# Patient Record
Sex: Male | Born: 1980 | Race: White | Hispanic: No | Marital: Single | Smoking: Never smoker
Health system: Southern US, Community
[De-identification: ages and names within clinical notes are randomized; demographics above are authoritative.]

---

## 2005-03-09 ENCOUNTER — Emergency Department (HOSPITAL_COMMUNITY): Admission: EM | Admit: 2005-03-09 | Discharge: 2005-03-09 | Payer: Self-pay | Admitting: Emergency Medicine

## 2018-01-16 ENCOUNTER — Other Ambulatory Visit: Payer: Self-pay | Admitting: Otolaryngology

## 2018-01-16 DIAGNOSIS — R202 Paresthesia of skin: Secondary | ICD-10-CM

## 2018-01-16 DIAGNOSIS — H9313 Tinnitus, bilateral: Secondary | ICD-10-CM

## 2018-01-23 ENCOUNTER — Ambulatory Visit: Payer: Self-pay

## 2018-01-23 ENCOUNTER — Ambulatory Visit
Admission: RE | Admit: 2018-01-23 | Discharge: 2018-01-23 | Disposition: A | Discharge status: Home / Self Care | Payer: BLUE CROSS/BLUE SHIELD | Source: Ambulatory Visit | Attending: Otolaryngology | Admitting: Otolaryngology

## 2018-01-23 ENCOUNTER — Encounter (INDEPENDENT_AMBULATORY_CARE_PROVIDER_SITE_OTHER): Payer: Self-pay

## 2018-01-23 DIAGNOSIS — H9313 Tinnitus, bilateral: Secondary | ICD-10-CM | POA: Diagnosis present

## 2018-01-23 DIAGNOSIS — R202 Paresthesia of skin: Secondary | ICD-10-CM | POA: Insufficient documentation

## 2018-01-23 MED ORDER — GADOBENATE DIMEGLUMINE 529 MG/ML IV SOLN
18.0000 mL | Freq: Once | INTRAVENOUS | Status: AC | PRN
  Administered 2018-01-23: 18 mL via INTRAVENOUS

## 2018-11-06 IMAGING — MR MR BRAIN/IAC WO/W
11 of 12 series · 41 of 48 positions shown · IV contrast (18 ML MULTIHANCE)
Comparison: None.

CLINICAL DATA: Bilateral tinnitus. Pain on the right. Family
history of acoustic neuroma. Duration of symptoms 1 month.

EXAM:
MRI HEAD WITHOUT AND WITH CONTRAST
TECHNIQUE: Multiplanar, multiecho pulse sequences of the brain and surrounding
structures were obtained without and with intravenous contrast.
CONTRAST:  18mL MULTIHANCE GADOBENATE DIMEGLUMINE 529 MG/ML IV SOLN

[Series 2: T1 · sagittal · 5.0mm · 0.45mm/px · 2 of 23 slices shown (1 of 3)]
[im 1/23]
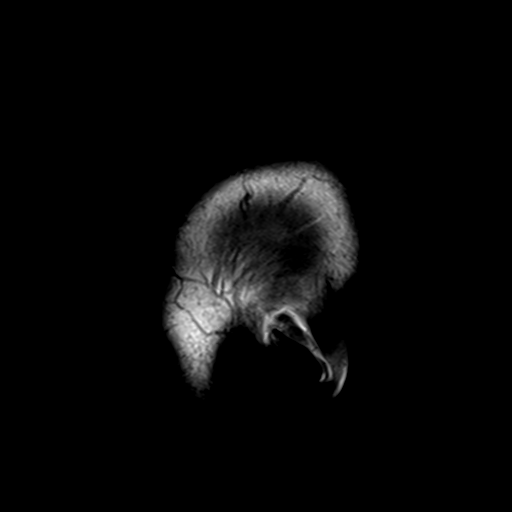
[im 23/23]
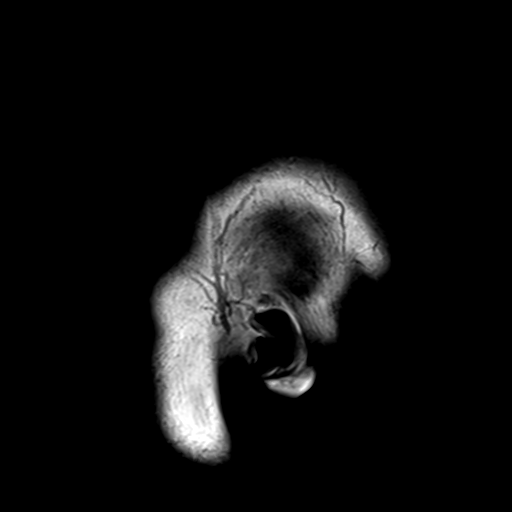

[Series 3: T2 · axial · 5.0mm · 0.72mm/px · z∈[-42,+119]mm · 3 of 24 slices shown (1 of 2)]
[im 1/24]
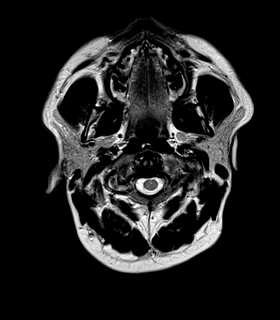
[im 12/24]
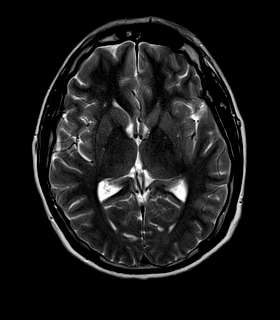
[im 24/24]
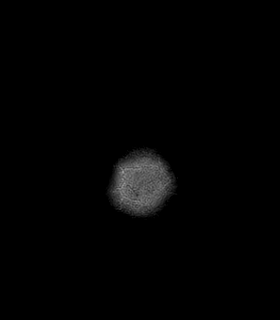

[Series 5: DWI · axial · 3.0mm · 1.20mm/px · z∈[-43,+119]mm · 7 of 55 slices shown (1 of 2)]
[im 1/55]
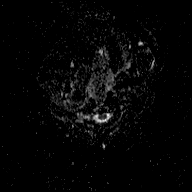
[im 10/55]
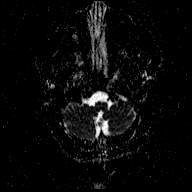
[im 19/55]
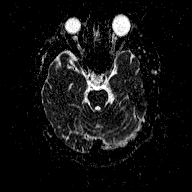
[im 28/55]
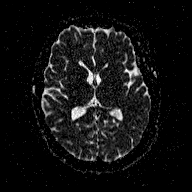
[im 37/55]
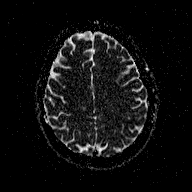
[im 46/55]
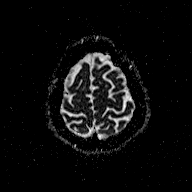
[im 55/55]
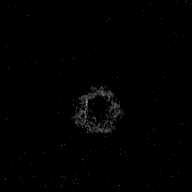

[Series 6: FLAIR · axial · 3.0mm · 0.45mm/px · z∈[-43,+119]mm · 7 of 55 slices shown]
[im 1/55]
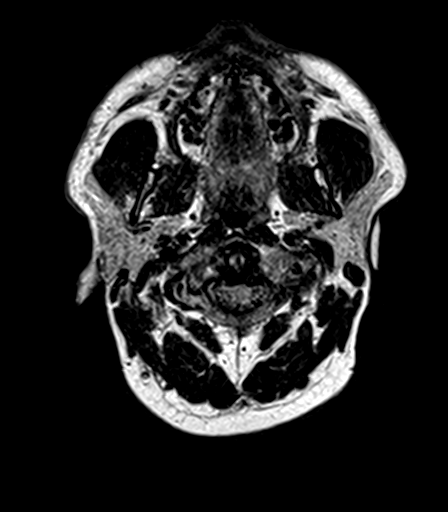
[im 10/55]
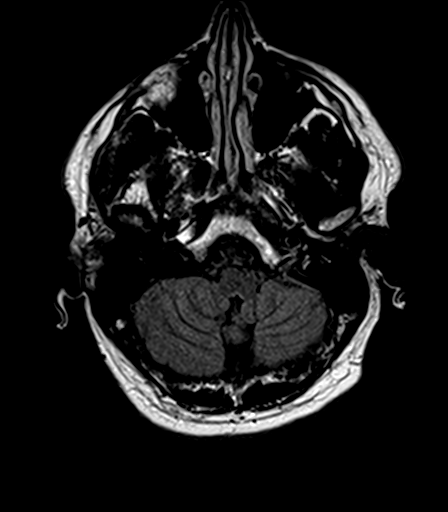
[im 19/55]
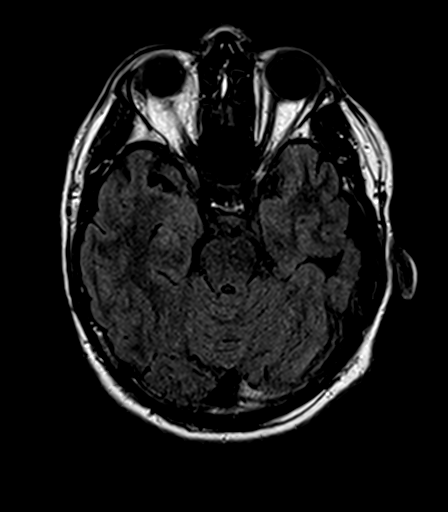
[im 28/55]
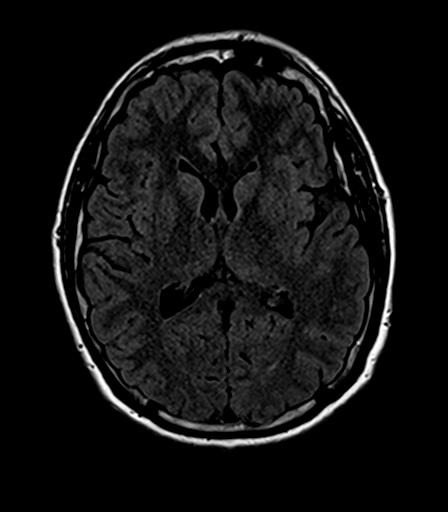
[im 37/55]
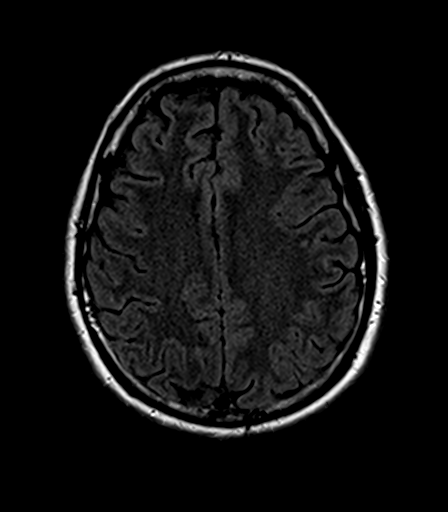
[im 46/55]
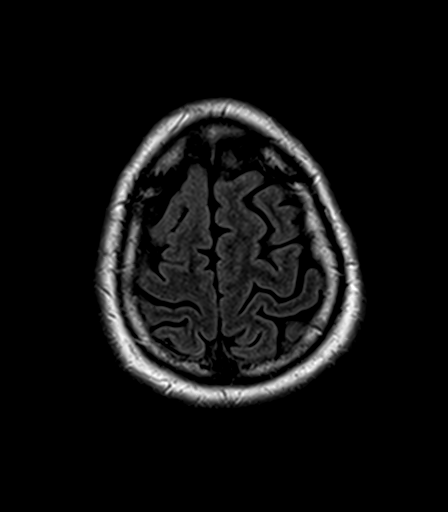
[im 55/55]
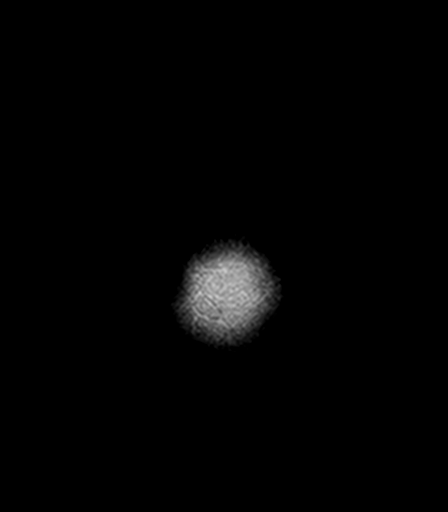

[Series 7: T2 · axial · 5.0mm · 0.72mm/px · z∈[-42,+119]mm · 3 of 24 slices shown (2 of 2)]
[im 1/24]
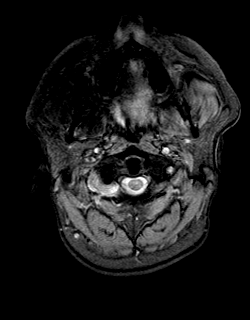
[im 12/24]
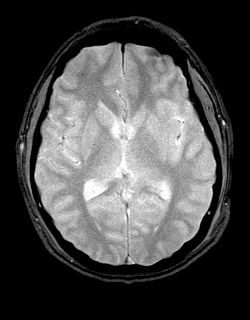
[im 24/24]
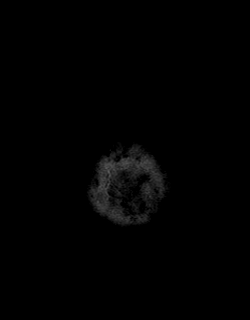

[Series 8: T1 · coronal · 3.0mm · 0.37mm/px · 1 of 11 slices shown (2 of 3)]
[im 1/11]
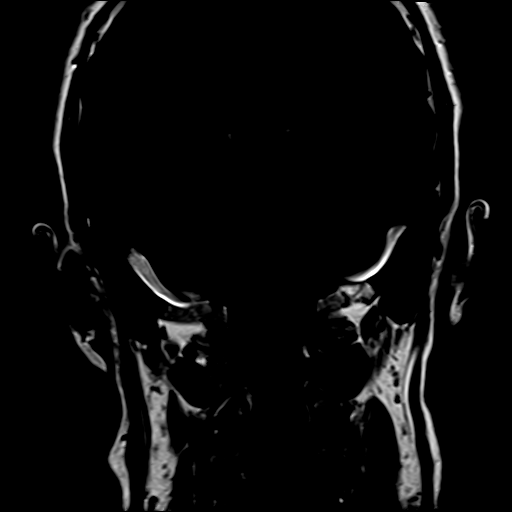

[Series 9: T1 · axial · 3.0mm · 0.37mm/px · 1 of 11 slices shown (3 of 3)]
[im 1/11]
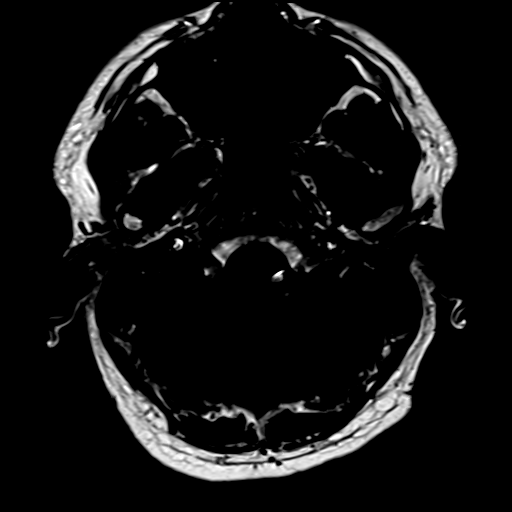

[Series 11: T1 post-contrast · axial · 3.0mm · 0.37mm/px · 1 of 11 slices shown (1 of 3)]
[im 1/11]
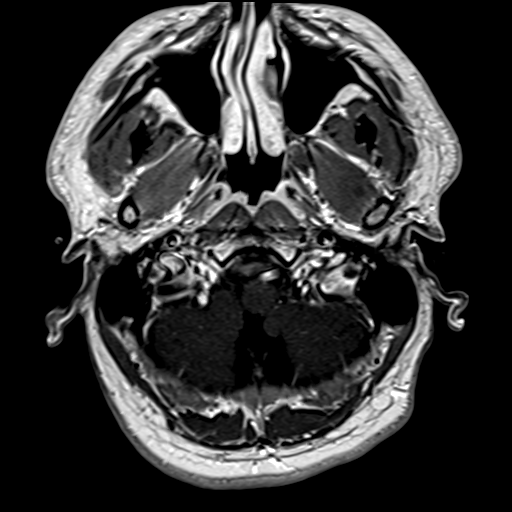

[Series 12: T1 post-contrast · coronal · 3.0mm · 0.37mm/px · 1 of 11 slices shown (2 of 3)]
[im 1/11]
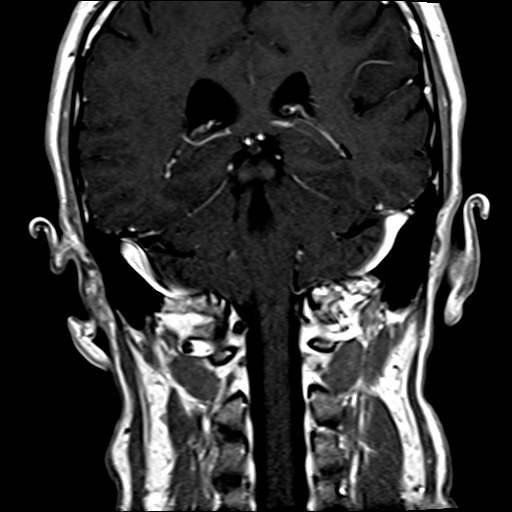

[Series 13: T1 post-contrast · axial · 3.0mm · 1.00mm/px · z∈[-56,+133]mm · 8 of 64 slices shown (3 of 3)]
[im 1/64]
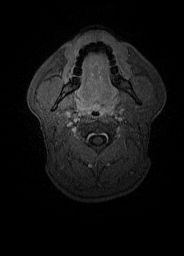
[im 10/64]
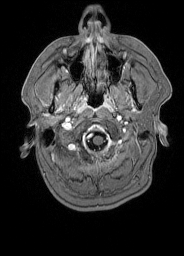
[im 19/64]
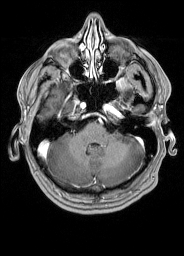
[im 28/64]
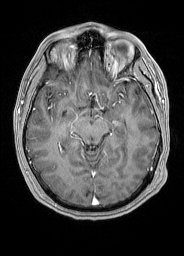
[im 37/64]
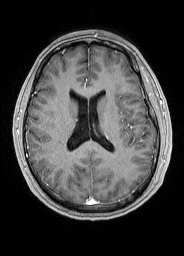
[im 46/64]
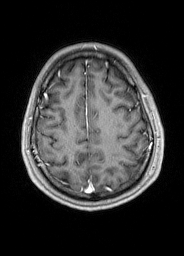
[im 55/64]
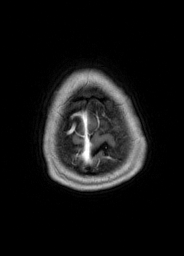
[im 64/64]
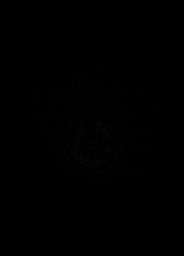

[Series 100: DWI · axial · 3.0mm · 1.20mm/px · z∈[-43,+119]mm · 7 of 55 slices shown (2 of 2)]
[im 1/55]
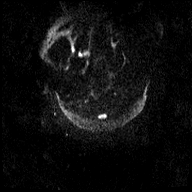
[im 10/55]
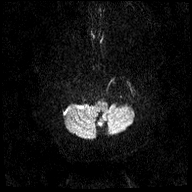
[im 19/55]
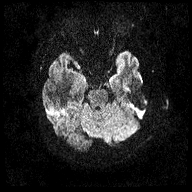
[im 28/55]
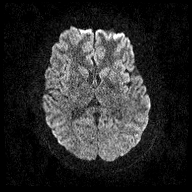
[im 37/55]
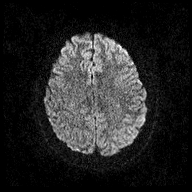
[im 46/55]
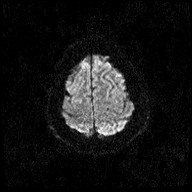
[im 55/55]
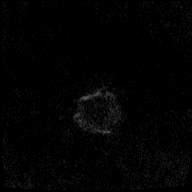

[41 of 48 positions shown; findings below may reference images not displayed]

FINDINGS: Brain: Brain has normal appearance without evidence of malformation,
atrophy, old or acute small or large vessel infarction, mass lesion,
hemorrhage, hydrocephalus or extra-axial collection. CP angle
regions are normal. No vestibular schwannoma. No enhancing neuritis.
No other brain enhancement occurs.

Vascular: Major vessels at the base of the brain show flow. Venous
sinuses appear patent.

Skull and upper cervical spine: Normal.

Sinuses/Orbits: Clear/normal.  Mastoids are clear.

Other: None significant.
IMPRESSION: Normal examination. No abnormality seen to explain the presenting
symptoms.

## 2020-11-09 ENCOUNTER — Ambulatory Visit: Payer: 59 | Admitting: Internal Medicine

## 2020-11-09 NOTE — Progress Notes (Signed)
No show for appointment. Office will call to reschedule.  

## 2020-11-15 NOTE — Progress Notes (Signed)
[facility] [address] [place], [place] [address]  Pulmonary Sleep Medicine   Office Visit Note  Patient Name: Shawn Powers. Oettinger DOB: 1980/12/11 MRN [id number]    Chief Complaint: Obstructive Sleep Apnea visit  Brief History:  Star is seen today for follow up The patient has a  2 year history of sleep apnea. Patient is using PAP nightly.  The patient feels more rested after sleeping with PAP.  The patient reports benefiting from PAP use. Reported sleepiness is  Improved but he is still tired and the Epworth Sleepiness Score is 6 out of 24. The patient occasionally take naps. The patient complains of the following: mask leaking because it is old  The compliance download shows excellent compliance with an average use time of 7.9 hours. The AHI is 0.7  The patient does not complain of limb movements disrupting sleep.  ROS  General: (-) fever, (-) chills, (-) night sweat Nose and Sinuses: (-) nasal stuffiness or itchiness, (-) postnasal drip, (-) nosebleeds, (-) sinus trouble. Mouth and Throat: (-) sore throat, (-) hoarseness. Neck: (-) swollen glands, (-) enlarged thyroid, (-) neck pain. Respiratory: - cough, - shortness of breath, - wheezing. Neurologic: - numbness, - tingling. Psychiatric: + anxiety, + depression   Current Medication: Outpatient Encounter Medications as of 11/16/2020  Medication Sig  . omeprazole (PRILOSEC) 10 MG capsule Take by mouth.  . sertraline (ZOLOFT) 25 MG tablet Take by mouth.   No facility-administered encounter medications on file as of 11/16/2020.    Surgical History: History reviewed. No pertinent surgical history.  Medical History: History reviewed. No pertinent past medical history.  Family History: Non contributory to the present illness  Social History: Social History   Socioeconomic History  . Marital status: Single    Spouse name: Not on file  . Number of children: Not on file  . Years of education: Not on file  .  Highest education level: Not on file  Occupational History  . Not on file  Tobacco Use  . Smoking status: Never Smoker  . Smokeless tobacco: Never Used  Substance and Sexual Activity  . Alcohol use: Yes    Comment: rarely  . Drug use: Never  . Sexual activity: Not on file  Other Topics Concern  . Not on file  Social History Narrative  . Not on file   Social Determinants of Health   Financial Resource Strain: Not on file  Food Insecurity: Not on file  Transportation Needs: Not on file  Physical Activity: Not on file  Stress: Not on file  Social Connections: Not on file  Intimate Partner Violence: Not on file    Vital Signs: Blood pressure 127/84, pulse 87, temperature 98.1 F (36.7 C), resp. rate 18, height 5\' 8"  (1.727 m), weight 226 lb (102.5 kg), SpO2 97 %.  Examination: General Appearance: The patient is well-developed, well-nourished, and in no distress. Neck Circumference: 44 cm Skin: Gross inspection of skin unremarkable. Head: normocephalic, no gross deformities. Eyes: no gross deformities noted. ENT: ears appear grossly normal Neurologic: Alert and oriented. No involuntary movements.    EPWORTH SLEEPINESS SCALE:  Scale:  (0)= no chance of dozing; (1)= slight chance of dozing; (2)= moderate chance of dozing; (3)= high chance of dozing  Chance  Situtation    Sitting and reading: 2    Watching TV: 1    Sitting Inactive in public: 0    As a passenger in car: 0      Lying down to  rest: 2    Sitting and talking: 0    Sitting quielty after lunch: 1    In a car, stopped in traffic: 0   TOTAL SCORE:   6 out of 24    SLEEP STUDIES:  1. PSG 11/24/18 AHI 44 Spo30min 84%   CPAP COMPLIANCE DATA:  Date Range: 11/15/19-11/13/20  Average Daily Use: 7.9 hours  Median Use: 7.9  Compliance for > 4 Hours: 94%  AHI: 0.7 respiratory events per hour  Days Used: 348/365  Mask Leak: 74.2  95th Percentile Pressure: 10         LABS: No  results found for this or any previous visit (from the past 2160 hour(s)).  Radiology: MR BRAIN/IAC W WO CONTRAST  Result Date: 01/23/2018 CLINICAL DATA:  Bilateral tinnitus. Pain on the right. Family history of acoustic neuroma. Duration of symptoms 1 month. EXAM: MRI HEAD WITHOUT AND WITH CONTRAST TECHNIQUE: Multiplanar, multiecho pulse sequences of the brain and surrounding structures were obtained without and with intravenous contrast. CONTRAST:  94mL MULTIHANCE GADOBENATE DIMEGLUMINE 529 MG/ML IV SOLN COMPARISON:  None. FINDINGS: Brain: Brain has normal appearance without evidence of malformation, atrophy, old or acute small or large vessel infarction, mass lesion, hemorrhage, hydrocephalus or extra-axial collection. CP angle regions are normal. No vestibular schwannoma. No enhancing neuritis. No other brain enhancement occurs. Vascular: Major vessels at the base of the brain show flow. Venous sinuses appear patent. Skull and upper cervical spine: Normal. Sinuses/Orbits: Clear/normal.  Mastoids are clear. Other: None significant. IMPRESSION: Normal examination. No abnormality seen to explain the presenting symptoms. Electronically Signed   By: Paulina Fusi M.D.   On: 01/23/2018 10:59    No results found.  No results found.    Assessment and Plan: Patient Active Problem List   Diagnosis Date Noted  . OSA (obstructive sleep apnea) 11/16/2020  . Gastroesophageal reflux disease without esophagitis 11/16/2020  . Anxiety and depression 11/16/2020  . Obesity (BMI 30.0-34.9) 11/16/2020      The patient does tolerate PAP and reports significant benefit from PAP use. The patient was reminded how to clean the CPAP and advised to follow up with his doctor regarding residual fatigue if replacing his mask does not improve it. Mask leak is high due to aging.The patient was also counselled on the importance of a good diet and regular exercise.  The compliance has been excellent and the apnea is  controlled  1. OSA (obstructive sleep apnea)  continue excellent compliance  2. CPAP use counseling CPAP couseling-Discussed importance of adequate CPAP use as well as proper care and cleaning techniques of machine and all supplies.  3. Gastroesophageal reflux disease without esophagitis Continue prilosec.  4. Anxiety and depression Continue zoloft and f/u with PCP.  5. Obesity (BMI 30.0-34.9) Obesity Counseling: Had a lengthy discussion regarding patients BMI and weight issues. Patient was instructed on portion control as well as increased activity. Also discussed caloric restrictions with trying to maintain intake less than 2000 Kcal. Discussions were made in accordance with the 5As of weight management. Simple actions such as not eating late and if able to, taking a walk is suggested.   General Counseling: I have discussed the findings of the evaluation and examination with Harvie Heck.  I have also discussed any further diagnostic evaluation thatmay be needed or ordered today. Janssen verbalizes understanding of the findings of todays visit. We also reviewed his medications today and discussed drug interactions and side effects including but not limited excessive drowsiness and altered mental  states. We also discussed that there is always a risk not just to him but also people around him. he has been encouraged to call the office with any questions or concerns that should arise related to todays visit.  No orders of the defined types were placed in this encounter.      I have personally obtained a history, examined the patient, evaluated laboratory and imaging results, formulated the assessment and plan and placed orders.  This patient was seen by Lynn Ito, PA-C in collaboration with Dr. Freda Munro as a part of collaborative care agreement.   Valentino Hue Sol Blazing, PhD, FAASM  Diplomate, American Board of Sleep Medicine    Yevonne Pax, MD [facility] Diplomate ABMS Pulmonary and Critical  Care Medicine Sleep medicine

## 2020-11-16 ENCOUNTER — Ambulatory Visit (INDEPENDENT_AMBULATORY_CARE_PROVIDER_SITE_OTHER): Payer: 59 | Admitting: Internal Medicine

## 2020-11-16 DIAGNOSIS — G4733 Obstructive sleep apnea (adult) (pediatric): Secondary | ICD-10-CM | POA: Diagnosis not present

## 2020-11-16 DIAGNOSIS — Z7189 Other specified counseling: Secondary | ICD-10-CM

## 2020-11-16 DIAGNOSIS — K219 Gastro-esophageal reflux disease without esophagitis: Secondary | ICD-10-CM

## 2020-11-16 DIAGNOSIS — F419 Anxiety disorder, unspecified: Secondary | ICD-10-CM

## 2020-11-16 DIAGNOSIS — E66811 Obesity, class 1: Secondary | ICD-10-CM | POA: Insufficient documentation

## 2020-11-16 DIAGNOSIS — F32A Depression, unspecified: Secondary | ICD-10-CM

## 2020-11-16 DIAGNOSIS — E669 Obesity, unspecified: Secondary | ICD-10-CM

## 2020-11-16 NOTE — Patient Instructions (Signed)

## 2021-11-28 ENCOUNTER — Ambulatory Visit (INDEPENDENT_AMBULATORY_CARE_PROVIDER_SITE_OTHER): Payer: 59 | Admitting: Internal Medicine

## 2021-11-28 VITALS — BP 141/90 | HR 86 | Resp 16 | Ht 68.0 in | Wt 242.0 lb

## 2021-11-28 DIAGNOSIS — Z7189 Other specified counseling: Secondary | ICD-10-CM

## 2021-11-28 DIAGNOSIS — F419 Anxiety disorder, unspecified: Secondary | ICD-10-CM

## 2021-11-28 DIAGNOSIS — G4733 Obstructive sleep apnea (adult) (pediatric): Secondary | ICD-10-CM | POA: Diagnosis not present

## 2021-11-28 DIAGNOSIS — K219 Gastro-esophageal reflux disease without esophagitis: Secondary | ICD-10-CM

## 2021-11-28 DIAGNOSIS — F32A Depression, unspecified: Secondary | ICD-10-CM

## 2021-11-28 DIAGNOSIS — E669 Obesity, unspecified: Secondary | ICD-10-CM

## 2021-11-28 NOTE — Progress Notes (Signed)
[facility] ?[address] ?[place], [place] [address] ? ?Pulmonary Sleep Medicine  ? ?Office Visit Note ? ?Patient Name: Shawn Powers ?DOB: 1980-09-20 ?MRN [id number] ? ? ? ?Chief Complaint: Obstructive Sleep Apnea visit ? ?Brief History: ? ?Shawn Powers is seen today for an annual follow up on CPAP@10cmh20 .  The patient has a 3 year  history of sleep apnea. Patient is using PAP nightly.  The patient feels rested  after sleeping with PAP.  The patient reports benefit  from PAP use. Reported sleepiness is  improved and the Epworth Sleepiness Score is 3 out of 24. The patient does not take naps. The patient complains of the following: no complaints.  The compliance download shows  98% compliance with an average use time of 8 hours. The AHI is 0.4  The patient does not complain of limb movements disrupting sleep. ? ?ROS ? ?General: (-) fever, (-) chills, (-) night sweat ?Nose and Sinuses: (-) nasal stuffiness or itchiness, (-) postnasal drip, (-) nosebleeds, (-) sinus trouble. ?Mouth and Throat: (-) sore throat, (-) hoarseness. ?Neck: (-) swollen glands, (-) enlarged thyroid, (-) neck pain. ?Respiratory: - cough, - shortness of breath, - wheezing. ?Neurologic: + numbness, + tingling. ?Psychiatric: - anxiety, - depression ? ? ?Current Medication: ?Outpatient Encounter Medications as of 11/28/2021  ?Medication Sig  ? omeprazole (PRILOSEC) 10 MG capsule Take by mouth.  ? sertraline (ZOLOFT) 25 MG tablet Take by mouth.  ? ?No facility-administered encounter medications on file as of 11/28/2021.  ? ? ?Surgical History: ?History reviewed. No pertinent surgical history. ? ?Medical History: ?History reviewed. No pertinent past medical history. ? ?Family History: ?Non contributory to the present illness ? ?Social History: ?Social History  ? ?Socioeconomic History  ? Marital status: Single  ?  Spouse name: Not on file  ? Number of children: Not on file  ? Years of education: Not on file  ? Highest education level: Not on  file  ?Occupational History  ? Not on file  ?Tobacco Use  ? Smoking status: Never  ? Smokeless tobacco: Never  ?Substance and Sexual Activity  ? Alcohol use: Yes  ?  Comment: rarely  ? Drug use: Never  ? Sexual activity: Not on file  ?Other Topics Concern  ? Not on file  ?Social History Narrative  ? Not on file  ? ?Social Determinants of Health  ? ?Financial Resource Strain: Not on file  ?Food Insecurity: Not on file  ?Transportation Needs: Not on file  ?Physical Activity: Not on file  ?Stress: Not on file  ?Social Connections: Not on file  ?Intimate Partner Violence: Not on file  ? ? ?Vital Signs: ?Blood pressure (!) 141/90, pulse 86, resp. rate 16, height 5\' 8"  (1.727 m), weight 242 lb (109.8 kg), SpO2 97 %. ?Body mass index is 36.8 kg/m?.  ? ? ?Examination: ?General Appearance: The patient is well-developed, well-nourished, and in no distress. ?Neck Circumference: 42cm ?Skin: Gross inspection of skin unremarkable. ?Head: normocephalic, no gross deformities. ?Eyes: no gross deformities noted. ?ENT: ears appear grossly normal ?Neurologic: Alert and oriented. No involuntary movements. ? ? ? ?EPWORTH SLEEPINESS SCALE: ? ?Scale:  ?(0)= no chance of dozing; (1)= slight chance of dozing; (2)= moderate chance of dozing; (3)= high chance of dozing ? ?Chance  Situtation ?   ?Sitting and reading: 1 ?  ? Watching TV: 1 ?   ?Sitting Inactive in public: 0 ?   ?As a passenger in car: 0   ?   ?Lying down to  rest: 1 ?   ?Sitting and talking: 0 ?   ?Sitting quielty after lunch: 0 ?   ?In a car, stopped in traffic: 0 ? ? ?TOTAL SCORE:   3 out of 24 ? ? ? ?SLEEP STUDIES: ? ?PSG (11/2018) AHI 44/hr, supine AHI 79/hr, min SP02 84% ?Titration (11/2018) CPAP@9cmh20  ? ? ?CPAP COMPLIANCE DATA: ? ?Date Range: 11/24/20-11/23/21 ? ?Average Daily Use: 8 hours ? ?Median Use: 7 hrs 58 min ? ?Compliance for > 4 Hours: 98% days ? ?AHI: 0.4 respiratory events per hour ? ?Days Used: 361/365 ? ?Mask Leak: 0.7 ? ?95th Percentile Pressure: 10  cmh20 ? ? ? ? ? ? ? ? ?LABS: ?No results found for this or any previous visit (from the past 2160 hour(s)). ? ?Radiology: ?MR BRAIN/IAC W WO CONTRAST ? ?Result Date: 01/23/2018 ?CLINICAL DATA:  Bilateral tinnitus. Pain on the right. Family history of acoustic neuroma. Duration of symptoms 1 month. EXAM: MRI HEAD WITHOUT AND WITH CONTRAST TECHNIQUE: Multiplanar, multiecho pulse sequences of the brain and surrounding structures were obtained without and with intravenous contrast. CONTRAST:  45mL MULTIHANCE GADOBENATE DIMEGLUMINE 529 MG/ML IV SOLN COMPARISON:  None. FINDINGS: Brain: Brain has normal appearance without evidence of malformation, atrophy, old or acute small or large vessel infarction, mass lesion, hemorrhage, hydrocephalus or extra-axial collection. CP angle regions are normal. No vestibular schwannoma. No enhancing neuritis. No other brain enhancement occurs. Vascular: Major vessels at the base of the brain show flow. Venous sinuses appear patent. Skull and upper cervical spine: Normal. Sinuses/Orbits: Clear/normal.  Mastoids are clear. Other: None significant. IMPRESSION: Normal examination. No abnormality seen to explain the presenting symptoms. Electronically Signed   By: Nelson Chimes M.D.   On: 01/23/2018 10:59  ? ? ?No results found. ? ?No results found. ? ? ? ?Assessment and Plan: ?Patient Active Problem List  ? Diagnosis Date Noted  ? OSA (obstructive sleep apnea) 11/16/2020  ? Gastroesophageal reflux disease without esophagitis 11/16/2020  ? Anxiety and depression 11/16/2020  ? Obesity (BMI 30.0-34.9) 11/16/2020  ? ? ? ? ?The patient does tolerate PAP and reports benefit from PAP use. The patient was reminded how to adjust mask fit and advised to change supplies regularly. The patient was also counselled on nightly use. The compliance is excellent. The AHI is 0.4. May schedule mask change due to air blowing on wife. ? ? ?1. OSA (obstructive sleep apnea) ?Continue excellent compliance ? ?2. CPAP  use counseling ?CPAP couseling-Discussed importance of adequate CPAP use as well as proper care and cleaning techniques of machine and all supplies. ? ?3. Anxiety and depression ?Continue current medication and f/u with PCP. ? ?4. Gastroesophageal reflux disease without esophagitis ?Continue PPI ? ?5. Obesity (BMI 30-39.9) ?Obesity Counseling: Had a lengthy discussion regarding patients BMI and weight issues. Patient was instructed on portion control as well as increased activity. Also discussed caloric restrictions with trying to maintain intake less than 2000 Kcal. Discussions were made in accordance with the 5As of weight management. Simple actions such as not eating late and if able to, taking a walk is suggested. ? ? ? ?General Counseling: I have discussed the findings of the evaluation and examination with Shawn Powers.  I have also discussed any further diagnostic evaluation thatmay be needed or ordered today. Shawn Powers verbalizes understanding of the findings of todays visit. We also reviewed his medications today and discussed drug interactions and side effects including but not limited excessive drowsiness and altered mental states. We also discussed that there is  always a risk not just to him but also people around him. he has been encouraged to call the office with any questions or concerns that should arise related to todays visit. ? ?No orders of the defined types were placed in this encounter. ?  ? ? ? ? ?I have personally obtained a history, examined the patient, evaluated laboratory and imaging results, formulated the assessment and plan and placed orders. ? ?This patient was seen by Drema Dallas, PA-C in collaboration with Dr. Devona Konig as a part of collaborative care agreement. ? ?Allyne Gee, MD FCCP ?Diplomate ABMS Pulmonary Critical Care Medicine and Sleep Medicine ?

## 2021-11-28 NOTE — Patient Instructions (Signed)

## 2022-11-26 NOTE — Progress Notes (Signed)
[facility] [address] [place], [place] [address]  Pulmonary Sleep Medicine   Office Visit Note  Patient Name: Shawn Powers. Mustard DOB: 03/20/1981 MRN [id number]    Chief Complaint: Obstructive Sleep Apnea visit  Brief History:  Syncere is seen today for an annual follow up visit for CPAP@ 10 cmH2O. The patient has a 4 year history of sleep apnea. Patient is using PAP nightly.  The patient feels rested after sleeping with PAP.  The patient reports benefiting from PAP use. Reported sleepiness is improved and the Epworth Sleepiness Score is 5 out of 24. The patient does not take naps. The patient complains of the following: none.  The compliance download shows 99% compliance with an average use time of 7 hours 47 minutes. The AHI is 0.4.  The patient does not complain of limb movements disrupting sleep. The patient continues to require PAP therapy in order to eliminate sleep apnea.   ROS  General: (-) fever, (-) chills, (-) night sweat Nose and Sinuses: (-) nasal stuffiness or itchiness, (-) postnasal drip, (-) nosebleeds, (-) sinus trouble. Mouth and Throat: (-) sore throat, (-) hoarseness. Neck: (-) swollen glands, (-) enlarged thyroid, (-) neck pain. Respiratory: - cough, - shortness of breath, - wheezing. Neurologic: - numbness, - tingling. Psychiatric: - anxiety, - depression   Current Medication: Outpatient Encounter Medications as of 11/28/2022  Medication Sig   pantoprazole (PROTONIX) 40 MG tablet Take 1 tablet by mouth daily.   sertraline (ZOLOFT) 25 MG tablet Take by mouth.   [DISCONTINUED] omeprazole (PRILOSEC) 10 MG capsule Take by mouth.   No facility-administered encounter medications on file as of 11/28/2022.    Surgical History: No past surgical history on file.  Medical History: No past medical history on file.  Family History: Non contributory to the present illness  Social History: Social History   Socioeconomic History   Marital status:  Single    Spouse name: Not on file   Number of children: Not on file   Years of education: Not on file   Highest education level: Not on file  Occupational History   Not on file  Tobacco Use   Smoking status: Never   Smokeless tobacco: Never  Substance and Sexual Activity   Alcohol use: Yes    Comment: rarely   Drug use: Never   Sexual activity: Not on file  Other Topics Concern   Not on file  Social History Narrative   Not on file   Social Determinants of Health   Financial Resource Strain: Not on file  Food Insecurity: Not on file  Transportation Needs: Not on file  Physical Activity: Not on file  Stress: Not on file  Social Connections: Not on file  Intimate Partner Violence: Not on file    Vital Signs: Blood pressure 138/87, pulse 73, resp. rate 18, height 5\' 8"  (1.727 m), weight 227 lb (103 kg), SpO2 96 %. Body mass index is 34.52 kg/m.    Examination: General Appearance: The patient is well-developed, well-nourished, and in no distress. Neck Circumference: 42 cm Skin: Gross inspection of skin unremarkable. Head: normocephalic, no gross deformities. Eyes: no gross deformities noted. ENT: ears appear grossly normal Neurologic: Alert and oriented. No involuntary movements.  STOP BANG RISK ASSESSMENT S (snore) Have you been told that you snore?     NO   T (tired) Are you often tired, fatigued, or sleepy during the day?   NO  O (obstruction) Do you stop breathing, choke, or gasp  during sleep? NO   P (pressure) Do you have or are you being treated for high blood pressure? NO   B (BMI) Is your body index greater than 35 kg/m? YES   A (age) Are you 40 years old or older? NO   N (neck) Do you have a neck circumference greater than 16 inches?   YES   G (gender) Are you a male? YES   TOTAL STOP/BANG "YES" ANSWERS 3       A STOP-Bang score of 2 or less is considered low risk, and a score of 5 or more is high risk for having either moderate or severe OSA.  For people who score 3 or 4, doctors may need to perform further assessment to determine how likely they are to have OSA.         EPWORTH SLEEPINESS SCALE:  Scale:  (0)= no chance of dozing; (1)= slight chance of dozing; (2)= moderate chance of dozing; (3)= high chance of dozing  Chance  Situtation    Sitting and reading: 1    Watching TV: 1    Sitting Inactive in public: 0    As a passenger in car: 1      Lying down to rest: 2    Sitting and talking: 0    Sitting quielty after lunch: 0    In a car, stopped in traffic: 0   TOTAL SCORE:   5 out of 24    SLEEP STUDIES:  PSG (11/2018) AHI 44/hr, Supine AHI 79/hr, min SpO2 84% Titration (11/2018) CPAP@ 9 cmH2O   CPAP COMPLIANCE DATA:  Date Range: 11/26/2021-11/25/2022  Average Daily Use: 7 hours 47 minutes  Median Use: 7 hours 46 minutes  Compliance for > 4 Hours: 99%  AHI: 0.4 respiratory events per hour  Days Used: 363/365 days  Mask Leak: 1.0  95th Percentile Pressure: 10         LABS: No results found for this or any previous visit (from the past 2160 hour(s)).  Radiology: MR BRAIN/IAC W WO CONTRAST  Result Date: 01/23/2018 CLINICAL DATA:  Bilateral tinnitus. Pain on the right. Family history of acoustic neuroma. Duration of symptoms 1 month. EXAM: MRI HEAD WITHOUT AND WITH CONTRAST TECHNIQUE: Multiplanar, multiecho pulse sequences of the brain and surrounding structures were obtained without and with intravenous contrast. CONTRAST:  54mL MULTIHANCE GADOBENATE DIMEGLUMINE 529 MG/ML IV SOLN COMPARISON:  None. FINDINGS: Brain: Brain has normal appearance without evidence of malformation, atrophy, old or acute small or large vessel infarction, mass lesion, hemorrhage, hydrocephalus or extra-axial collection. CP angle regions are normal. No vestibular schwannoma. No enhancing neuritis. No other brain enhancement occurs. Vascular: Major vessels at the base of the brain show flow. Venous sinuses appear  patent. Skull and upper cervical spine: Normal. Sinuses/Orbits: Clear/normal.  Mastoids are clear. Other: None significant. IMPRESSION: Normal examination. No abnormality seen to explain the presenting symptoms. Electronically Signed   By: Nelson Chimes M.D.   On: 01/23/2018 10:59    No results found.  No results found.    Assessment and Plan: Patient Active Problem List   Diagnosis Date Noted   OSA (obstructive sleep apnea) 11/16/2020   Gastroesophageal reflux disease without esophagitis 11/16/2020   Anxiety and depression 11/16/2020   Obesity (BMI 30.0-34.9) 11/16/2020      The patient does tolerate PAP and reports does benefit from PAP use. The patient was reminded how to adjust mask fit and advised to change supplies regularly. The patient was also counselled  on nightly use. The compliance is excellent. The AHI is 0.4. Pt continues to require cpap to treat his apnea and is medically necessary.  1. OSA (obstructive sleep apnea) Continue excellent compliance  2. CPAP use counseling CPAP couseling-Discussed importance of adequate CPAP use as well as proper care and cleaning techniques of machine and all supplies.  3. Anxiety and depression Continue current medication and f/u with PCP.  4. Gastroesophageal reflux disease without esophagitis Continue PPI  5. Obesity (BMI 30-39.9) Obesity Counseling: Had a lengthy discussion regarding patients BMI and weight issues. Patient was instructed on portion control as well as increased activity. Also discussed caloric restrictions with trying to maintain intake less than 2000 Kcal. Discussions were made in accordance with the 5As of weight management. Simple actions such as not eating late and if able to, taking a walk is suggested.    General Counseling: I have discussed the findings of the evaluation and examination with Shawn Powers.  I have also discussed any further diagnostic evaluation thatmay be needed or ordered today. Shawn Powers verbalizes  understanding of the findings of todays visit. We also reviewed his medications today and discussed drug interactions and side effects including but not limited excessive drowsiness and altered mental states. We also discussed that there is always a risk not just to him but also people around him. he has been encouraged to call the office with any questions or concerns that should arise related to todays visit.  No orders of the defined types were placed in this encounter.       I have personally obtained a history, examined the patient, evaluated laboratory and imaging results, formulated the assessment and plan and placed orders.  This patient was seen by Drema Dallas, PA-C in collaboration with Dr. Devona Konig as a part of collaborative care agreement.  Allyne Gee, MD [facility] Diplomate ABMS Pulmonary Critical Care Medicine and Sleep Medicine

## 2022-11-28 ENCOUNTER — Ambulatory Visit (INDEPENDENT_AMBULATORY_CARE_PROVIDER_SITE_OTHER): Payer: 59 | Admitting: Internal Medicine

## 2022-11-28 VITALS — BP 138/87 | HR 73 | Resp 18 | Ht 68.0 in | Wt 227.0 lb

## 2022-11-28 DIAGNOSIS — Z7189 Other specified counseling: Secondary | ICD-10-CM

## 2022-11-28 DIAGNOSIS — E669 Obesity, unspecified: Secondary | ICD-10-CM

## 2022-11-28 DIAGNOSIS — F419 Anxiety disorder, unspecified: Secondary | ICD-10-CM | POA: Diagnosis not present

## 2022-11-28 DIAGNOSIS — G4733 Obstructive sleep apnea (adult) (pediatric): Secondary | ICD-10-CM

## 2022-11-28 DIAGNOSIS — K219 Gastro-esophageal reflux disease without esophagitis: Secondary | ICD-10-CM | POA: Diagnosis not present

## 2022-11-28 DIAGNOSIS — F32A Depression, unspecified: Secondary | ICD-10-CM

## 2022-11-28 NOTE — Patient Instructions (Signed)

## 2023-11-26 ENCOUNTER — Ambulatory Visit (INDEPENDENT_AMBULATORY_CARE_PROVIDER_SITE_OTHER): Payer: Self-pay | Admitting: Internal Medicine

## 2023-11-26 VITALS — BP 131/82 | HR 67 | Resp 16 | Ht 68.0 in | Wt 197.0 lb

## 2023-11-26 DIAGNOSIS — K219 Gastro-esophageal reflux disease without esophagitis: Secondary | ICD-10-CM

## 2023-11-26 DIAGNOSIS — Z7189 Other specified counseling: Secondary | ICD-10-CM

## 2023-11-26 DIAGNOSIS — G4733 Obstructive sleep apnea (adult) (pediatric): Secondary | ICD-10-CM | POA: Diagnosis not present

## 2023-11-26 DIAGNOSIS — F32A Depression, unspecified: Secondary | ICD-10-CM

## 2023-11-26 DIAGNOSIS — F419 Anxiety disorder, unspecified: Secondary | ICD-10-CM

## 2023-11-26 NOTE — Patient Instructions (Signed)

## 2023-11-26 NOTE — Progress Notes (Signed)
 [facility] [address] [place], [place] [address]  Pulmonary Sleep Medicine   Office Visit Note  Patient Name: Shawn Powers. Shawn Powers DOB: [birth year] MRN [id number]    Chief Complaint: Obstructive Sleep Apnea visit  Brief History:  Shawn Powers is seen today for an annual follow up visit for CPAP@ 10 cmH2O. The patient has a 5 year history of sleep apnea. Patient is using PAP nightly.  The patient feels rested after sleeping with PAP.  The patient reports benefiting from PAP use. Reported sleepiness is  improved and the Epworth Sleepiness Score is 4 out of 24. The patient does not take naps. The patient complains of the following: none.  The compliance download shows 99% compliance with an average use time of 7 hours 37 minutes. The AHI is 0.4. The patient does not complain of limb movements disrupting sleep. The patient continues to require PAP therapy in order to eliminate sleep apnea.   ROS  General: (-) fever, (-) chills, (-) night sweat Nose and Sinuses: (-) nasal stuffiness or itchiness, (-) postnasal drip, (-) nosebleeds, (-) sinus trouble. Mouth and Throat: (-) sore throat, (-) hoarseness. Neck: (-) swollen glands, (-) enlarged thyroid, (-) neck pain. Respiratory: - cough, - shortness of breath, - wheezing. Neurologic: - numbness, - tingling. Psychiatric: - anxiety, - depression   Current Medication: Outpatient Encounter Medications as of 11/26/2023  Medication Sig   pantoprazole (PROTONIX) 40 MG tablet Take 1 tablet by mouth daily.   sertraline (ZOLOFT) 25 MG tablet Take by mouth.   No facility-administered encounter medications on file as of 11/26/2023.    Surgical History: History reviewed. No pertinent surgical history.  Medical History: History reviewed. No pertinent past medical history.  Family History: Non contributory to the present illness  Social History: Social History   Socioeconomic History   Marital status: Single    Spouse name: Not on file    Number of children: Not on file   Years of education: Not on file   Highest education level: Not on file  Occupational History   Not on file  Tobacco Use   Smoking status: Never   Smokeless tobacco: Never  Substance and Sexual Activity   Alcohol use: Yes    Comment: rarely   Drug use: Never   Sexual activity: Not on file  Other Topics Concern   Not on file  Social History Narrative   Not on file   Social Drivers of Health   Financial Resource Strain: Not on file  Food Insecurity: Not on file  Transportation Needs: Not on file  Physical Activity: Not on file  Stress: Not on file  Social Connections: Not on file  Intimate Partner Violence: Not on file    Vital Signs: Blood pressure 131/82, pulse 67, resp. rate 16, height 5\' 8"  (1.727 m), weight 197 lb (89.4 kg), SpO2 98%. Body mass index is 29.95 kg/m.    Examination: General Appearance: The patient is well-developed, well-nourished, and in no distress. Neck Circumference: 42 cm Skin: Gross inspection of skin unremarkable. Head: normocephalic, no gross deformities. Eyes: no gross deformities noted. ENT: ears appear grossly normal Neurologic: Alert and oriented. No involuntary movements.  STOP BANG RISK ASSESSMENT S (snore) Have you been told that you snore?     YES   T (tired) Are you often tired, fatigued, or sleepy during the day?   NO  O (obstruction) Do you stop breathing, choke, or gasp during sleep? NO   P (pressure) Do you have or  are you being treated for high blood pressure? NO   B (BMI) Is your body index greater than 35 kg/m? YES   A (age) Are you 23 years old or older? NO   N (neck) Do you have a neck circumference greater than 16 inches?   YES   G (gender) Are you a male? YES   TOTAL STOP/BANG "YES" ANSWERS 4       A STOP-Bang score of 2 or less is considered low risk, and a score of 5 or more is high risk for having either moderate or severe OSA. For people who score 3 or 4, doctors may  need to perform further assessment to determine how likely they are to have OSA.         EPWORTH SLEEPINESS SCALE:  Scale:  (0)= no chance of dozing; (1)= slight chance of dozing; (2)= moderate chance of dozing; (3)= high chance of dozing  Chance  Situtation    Sitting and reading: 1    Watching TV: 1    Sitting Inactive in public: 0    As a passenger in car: 1      Lying down to rest: 1    Sitting and talking: 0    Sitting quielty after lunch: 0    In a car, stopped in traffic: 0   TOTAL SCORE:   4 out of 24    SLEEP STUDIES:  PSG - 11/2018 - AHI 44/hr, Supin AHI 79/hr, min Sp02 84% Titraiton - 11/2018 - CPAP @ 9cmH20   CPAP COMPLIANCE DATA:  Date Range: 11/24/2022-11/23/2023  Average Daily Use: 7 hours 37 minutes  Median Use: 7 hours 36 minutes  Compliance for > 4 Hours: 99%  AHI: 0.4 respiratory events per hour  Days Used: 364/365 days  Mask Leak: 4.3  95th Percentile Pressure: 10         LABS: No results found for this or any previous visit (from the past 2160 hours).  Radiology: MR BRAIN/IAC W WO CONTRAST Result Date: 01/23/2018 CLINICAL DATA:  Bilateral tinnitus. Pain on the right. Family history of acoustic neuroma. Duration of symptoms 1 month. EXAM: MRI HEAD WITHOUT AND WITH CONTRAST TECHNIQUE: Multiplanar, multiecho pulse sequences of the brain and surrounding structures were obtained without and with intravenous contrast. CONTRAST:  18mL MULTIHANCE GADOBENATE DIMEGLUMINE 529 MG/ML IV SOLN COMPARISON:  None. FINDINGS: Brain: Brain has normal appearance without evidence of malformation, atrophy, old or acute small or large vessel infarction, mass lesion, hemorrhage, hydrocephalus or extra-axial collection. CP angle regions are normal. No vestibular schwannoma. No enhancing neuritis. No other brain enhancement occurs. Vascular: Major vessels at the base of the brain show flow. Venous sinuses appear patent. Skull and upper cervical spine: Normal.  Sinuses/Orbits: Clear/normal.  Mastoids are clear. Other: None significant. IMPRESSION: Normal examination. No abnormality seen to explain the presenting symptoms. Electronically Signed   By: Paulina Fusi M.D.   On: 01/23/2018 10:59    No results found.  No results found.    Assessment and Plan: Patient Active Problem List   Diagnosis Date Noted   OSA (obstructive sleep apnea) 11/16/2020   Gastroesophageal reflux disease without esophagitis 11/16/2020   Anxiety and depression 11/16/2020   Obesity (BMI 30.0-34.9) 11/16/2020      The patient does tolerate PAP and reports benefit from PAP use. The patient was reminded how to adjust mask fit and advised to change supplies regularly. The patient was also counselled on nightly use. The compliance is excellent. The AHI  is 0.4. Patient continues to require PAP to treat their apnea and is medically necessary.  1. OSA (obstructive sleep apnea) (Primary) Continue excellent compliance  2. CPAP use counseling CPAP couseling-Discussed importance of adequate CPAP use as well as proper care and cleaning techniques of machine and all supplies.  3. Anxiety and depression Continue current medication and f/u with PCP.  4. Gastroesophageal reflux disease without esophagitis Continue current medication and f/u with PCP.     General Counseling: I have discussed the findings of the evaluation and examination with Harvie Heck.  I have also discussed any further diagnostic evaluation thatmay be needed or ordered today. Kevonta verbalizes understanding of the findings of todays visit. We also reviewed his medications today and discussed drug interactions and side effects including but not limited excessive drowsiness and altered mental states. We also discussed that there is always a risk not just to him but also people around him. he has been encouraged to call the office with any questions or concerns that should arise related to todays visit.  No orders of  the defined types were placed in this encounter.       I have personally obtained a history, examined the patient, evaluated laboratory and imaging results, formulated the assessment and plan and placed orders.  This patient was seen by Lynn Ito, PA-C in collaboration with Dr. Freda Munro as a part of collaborative care agreement.  Yevonne Pax, MD [facility] Diplomate ABMS Pulmonary Critical Care Medicine and Sleep Medicine
